# Patient Record
Sex: Female | Born: 2000 | Race: White | Hispanic: No | Marital: Single | Smoking: Never smoker
Health system: Southern US, Community
[De-identification: ages and names within clinical notes are randomized; demographics above are authoritative.]

---
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
## 2021-08-04 ENCOUNTER — Other Ambulatory Visit: Payer: Self-pay

## 2021-08-04 ENCOUNTER — Encounter (HOSPITAL_COMMUNITY): Payer: Self-pay | Admitting: Emergency Medicine

## 2021-08-04 ENCOUNTER — Emergency Department (HOSPITAL_COMMUNITY)
Admission: EM | Admit: 2021-08-04 | Discharge: 2021-08-04 | Disposition: A | Discharge status: Home / Self Care | Payer: Self-pay | Attending: Emergency Medicine | Admitting: Emergency Medicine

## 2021-08-04 ENCOUNTER — Emergency Department (HOSPITAL_COMMUNITY): Payer: Self-pay

## 2021-08-04 DIAGNOSIS — T148XXA Other injury of unspecified body region, initial encounter: Secondary | ICD-10-CM

## 2021-08-04 DIAGNOSIS — S0990XA Unspecified injury of head, initial encounter: Secondary | ICD-10-CM | POA: Insufficient documentation

## 2021-08-04 DIAGNOSIS — Y990 Civilian activity done for income or pay: Secondary | ICD-10-CM | POA: Insufficient documentation

## 2021-08-04 DIAGNOSIS — S022XXA Fracture of nasal bones, initial encounter for closed fracture: Secondary | ICD-10-CM | POA: Insufficient documentation

## 2021-08-04 DIAGNOSIS — R000 Tachycardia, unspecified: Secondary | ICD-10-CM | POA: Insufficient documentation

## 2021-08-04 MED ORDER — ACETAMINOPHEN 325 MG PO TABS
650.0000 mg | ORAL_TABLET | Freq: Once | ORAL | Status: AC
  Administered 2021-08-04: 650 mg via ORAL
  Filled 2021-08-04: qty 2
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
## 2021-08-04 NOTE — Discharge Instructions (Addendum)
The CT scan of your face shows that you have a right-sided nasal bone fracture. You will need to follow-up with the ENT provider in 5 to 7 days regarding this fracture. Continue Tylenol to help with your symptoms. Return to the ER if you start to experience worsening headache, blurry vision, trouble walking, chest pain or shortness of breath.
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
## 2021-08-04 NOTE — ED Notes (Signed)
Pt ambulated to RR without complaint.

## 2021-08-05 ENCOUNTER — Emergency Department (HOSPITAL_COMMUNITY)
Admission: EM | Admit: 2021-08-05 | Discharge: 2021-08-05 | Disposition: A | Discharge status: Home / Self Care | Payer: Self-pay | Attending: Emergency Medicine | Admitting: Emergency Medicine

## 2021-08-05 ENCOUNTER — Encounter (HOSPITAL_COMMUNITY): Payer: Self-pay

## 2021-08-05 ENCOUNTER — Other Ambulatory Visit: Payer: Self-pay

## 2021-08-05 DIAGNOSIS — R4781 Slurred speech: Secondary | ICD-10-CM | POA: Insufficient documentation

## 2021-08-05 DIAGNOSIS — R5383 Other fatigue: Secondary | ICD-10-CM | POA: Insufficient documentation

## 2021-08-05 DIAGNOSIS — S060XAD Concussion with loss of consciousness status unknown, subsequent encounter: Secondary | ICD-10-CM | POA: Insufficient documentation

## 2021-08-05 DIAGNOSIS — S0033XD Contusion of nose, subsequent encounter: Secondary | ICD-10-CM | POA: Insufficient documentation

## 2021-08-05 DIAGNOSIS — M25511 Pain in right shoulder: Secondary | ICD-10-CM | POA: Insufficient documentation

## 2021-08-05 DIAGNOSIS — M25512 Pain in left shoulder: Secondary | ICD-10-CM | POA: Insufficient documentation

## 2021-08-05 DIAGNOSIS — M542 Cervicalgia: Secondary | ICD-10-CM | POA: Insufficient documentation

## 2021-08-05 DIAGNOSIS — Y990 Civilian activity done for income or pay: Secondary | ICD-10-CM | POA: Insufficient documentation

## 2021-08-05 MED ORDER — DICLOFENAC SODIUM 1 % EX GEL
4.0000 g | Freq: Once | CUTANEOUS | Status: DC
  Filled 2021-08-05: qty 100
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
## 2021-08-05 NOTE — Discharge Instructions (Addendum)
Have you start vomiting profusely and cannot hold anything down or you suddenly become confused you should return to the emergency room.  Avoid any eyestrain like reading, staring at screens.  Make sure you are getting plenty of rest.  Try Tylenol and IcyHot or Voltaren gel
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
